# Patient Record
Sex: Female | Born: 1950
Health system: Southern US, Community
[De-identification: ages and names within clinical notes are randomized; demographics above are authoritative.]

## PROBLEM LIST (undated history)

## (undated) DIAGNOSIS — F419 Anxiety disorder, unspecified: Secondary | ICD-10-CM

## (undated) DIAGNOSIS — E785 Hyperlipidemia, unspecified: Secondary | ICD-10-CM

## (undated) DIAGNOSIS — I10 Essential (primary) hypertension: Secondary | ICD-10-CM

## (undated) DIAGNOSIS — K219 Gastro-esophageal reflux disease without esophagitis: Secondary | ICD-10-CM

## (undated) DIAGNOSIS — C801 Malignant (primary) neoplasm, unspecified: Secondary | ICD-10-CM

## (undated) DIAGNOSIS — E079 Disorder of thyroid, unspecified: Secondary | ICD-10-CM

## (undated) HISTORY — DX: Hyperlipidemia, unspecified: E78.5

## (undated) HISTORY — DX: Malignant (primary) neoplasm, unspecified: C80.1

## (undated) HISTORY — DX: Anxiety disorder, unspecified: F41.9

## (undated) HISTORY — DX: Gastro-esophageal reflux disease without esophagitis: K21.9

## (undated) HISTORY — PX: TONSILLECTOMY: SUR1361

---

## 2017-02-19 ENCOUNTER — Encounter: Payer: Self-pay | Admitting: Gastroenterology

## 2017-04-11 ENCOUNTER — Encounter: Payer: Self-pay | Admitting: Gastroenterology

## 2017-05-02 ENCOUNTER — Encounter: Payer: Self-pay | Admitting: Family Medicine

## 2018-03-11 ENCOUNTER — Encounter: Payer: Self-pay | Admitting: Gastroenterology

## 2018-05-08 ENCOUNTER — Encounter: Payer: Self-pay | Admitting: Gastroenterology

## 2019-09-02 ENCOUNTER — Encounter: Payer: Self-pay | Admitting: Gastroenterology

## 2019-10-08 ENCOUNTER — Encounter: Payer: Self-pay | Admitting: Gastroenterology

## 2020-01-03 ENCOUNTER — Ambulatory Visit: Payer: Self-pay

## 2020-01-09 ENCOUNTER — Ambulatory Visit: Payer: Medicare PPO | Attending: Internal Medicine

## 2020-01-09 DIAGNOSIS — Z23 Encounter for immunization: Secondary | ICD-10-CM | POA: Insufficient documentation

## 2020-01-09 NOTE — Progress Notes (Signed)
   Covid-19 Vaccination Clinic  Name:  Bianca Nelson    MRN: [id number] DOB: September 07, 1951  01/09/2020  [name] was observed post Covid-19 immunization for 15 minutes without incidence. She was provided with Vaccine Information Sheet and instruction to access the V-Safe system.   [name] was instructed to call 911 with any severe reactions post vaccine: Marland Kitchen Difficulty breathing  . Swelling of your face and throat  . A fast heartbeat  . A bad rash all over your body  . Dizziness and weakness    Immunizations Administered    Name Date Dose VIS Date Route   Pfizer COVID-19 Vaccine 01/09/2020  2:28 PM 0.3 mL 11/14/2019 Intramuscular   Manufacturer: [place]   Lot: [id number]   Christopher: [id number]

## 2020-01-14 ENCOUNTER — Ambulatory Visit: Payer: Self-pay

## 2020-02-03 ENCOUNTER — Ambulatory Visit: Payer: Medicare PPO | Attending: Internal Medicine

## 2020-02-03 DIAGNOSIS — Z23 Encounter for immunization: Secondary | ICD-10-CM | POA: Insufficient documentation

## 2020-02-03 NOTE — Progress Notes (Signed)
   Covid-19 Vaccination Clinic  Name:  Bianca Nelson    MRN: [id number] DOB: 04-07-1951  02/03/2020  Bianca Nelson was observed post Covid-19 immunization for 30 minutes based on pre-vaccination screening without incident. She was provided with Vaccine Information Sheet and instruction to access the V-Safe system.   Bianca Nelson was instructed to call 911 with any severe reactions post vaccine: Marland Kitchen Difficulty breathing  . Swelling of face and throat  . A fast heartbeat  . A bad rash all over body  . Dizziness and weakness   Immunizations Administered    Name Date Dose VIS Date Route   Pfizer COVID-19 Vaccine 02/03/2020  2:45 PM 0.3 mL 11/14/2019 Intramuscular   Manufacturer: [place]   Lot: [id number]   [place]: [id number]

## 2020-03-16 ENCOUNTER — Emergency Department (HOSPITAL_COMMUNITY): Payer: Medicare PPO

## 2020-03-16 ENCOUNTER — Emergency Department (HOSPITAL_COMMUNITY)
Admission: EM | Admit: 2020-03-16 | Discharge: 2020-03-16 | Disposition: A | Discharge status: Home / Self Care | Payer: Medicare PPO | Attending: Emergency Medicine | Admitting: Emergency Medicine

## 2020-03-16 ENCOUNTER — Other Ambulatory Visit: Payer: Self-pay

## 2020-03-16 ENCOUNTER — Encounter (HOSPITAL_COMMUNITY): Payer: Self-pay | Admitting: Emergency Medicine

## 2020-03-16 DIAGNOSIS — R002 Palpitations: Secondary | ICD-10-CM | POA: Insufficient documentation

## 2020-03-16 DIAGNOSIS — I10 Essential (primary) hypertension: Secondary | ICD-10-CM | POA: Insufficient documentation

## 2020-03-16 HISTORY — DX: Essential (primary) hypertension: I10

## 2020-03-16 HISTORY — DX: Disorder of thyroid, unspecified: E07.9

## 2020-03-16 LAB — BASIC METABOLIC PANEL
Anion gap: 10 (ref 5–15)
BUN: 15 mg/dL (ref 8–23)
CO2: 25 mmol/L (ref 22–32)
Calcium: 9.4 mg/dL (ref 8.9–10.3)
Chloride: 99 mmol/L (ref 98–111)
Creatinine, Ser: 0.71 mg/dL (ref 0.44–1.00)
GFR calc Af Amer: >60 mL/min (ref >60)
GFR calc non Af Amer: >60 mL/min (ref >60)
Glucose, Bld: 118 mg/dL — ABNORMAL HIGH (ref 70–99)
Potassium: 4.2 mmol/L (ref 3.5–5.1)
Sodium: 134 mmol/L — ABNORMAL LOW (ref 135–145)

## 2020-03-16 LAB — CBC
HCT: 41.2 % (ref 36.0–46.0)
Hemoglobin: 13.6 g/dL (ref 12.0–15.0)
MCH: 29.5 pg (ref 26.0–34.0)
MCHC: 33 g/dL (ref 30.0–36.0)
MCV: 89.4 fL (ref 80.0–100.0)
Platelets: 483 10*3/uL — ABNORMAL HIGH (ref 150–400)
RBC: 4.61 MIL/uL (ref 3.87–5.11)
RDW: 12.6 % (ref 11.5–15.5)
WBC: 10.5 10*3/uL (ref 4.0–10.5)
nRBC: 0 % (ref 0.0–0.2)

## 2020-03-16 LAB — TROPONIN I (HIGH SENSITIVITY)
Troponin I (High Sensitivity): <2 ng/L (ref <18)
Troponin I (High Sensitivity): <2 ng/L (ref <18)

## 2020-03-16 LAB — TSH: TSH: 2.616 u[IU]/mL (ref 0.350–4.500)

## 2020-03-16 MED ORDER — SODIUM CHLORIDE 0.9% FLUSH: 3.0000 mL | Freq: Once | INTRAVENOUS | Status: DC

## 2020-03-16 NOTE — ED Notes (Signed)
ED Provider at bedside. 

## 2020-03-16 NOTE — Discharge Instructions (Addendum)
We saw you in the ER for palpitations. All the results in the ER are normal, labs and imaging. We are not sure what is causing your symptoms.  The workup in the ER is not complete, and is limited to screening for life threatening and emergent conditions only, so please see a primary care doctor for further evaluation.

## 2020-03-16 NOTE — ED Notes (Signed)
An After Visit Summary was printed and given to the patient. Discharge instructions given and no further questions at this time. Pt denies palpitations at this time.

## 2020-03-16 NOTE — ED Provider Notes (Addendum)
[facility] DEPT Provider Note   CSN: [id number] Arrival date & time: 03/16/20  0736     History Chief Complaint  Patient presents with  . Palpitations    Bianca Nelson is a 69 y.o. female.  HPI     69 year old female comes in a chief complaint of palpitations. Patient has history of hypertension and thyroid disease.  There is no history of CAD/CHF/arrhythmia.  Patient reports that last night she started having episodes of palpitations around 11 PM.  The symptoms lasted for several minutes.  She describes the symptoms as " pounding in her chest" " fast heartbeat".  She had some associated neck discomfort.  Patient denies any associated dizziness, lightheadedness.  She woke up early in the morning and had similar symptoms and decided to come to the ER.  She suspects the last episode of palpitations was around 7 AM.   Patient has pain in her neck.  She denies any recent exertional chest pain, jaw pain.  There is no associated nausea, shortness of breath.  Past Medical History:  Diagnosis Date  . Hypertension   . Thyroid disease     There are no problems to display for this patient.    OB History   No obstetric history on file.     No family history on file.  Social History   Tobacco Use  . Smoking status: Never Smoker  . Smokeless tobacco: Never Used  Substance Use Topics  . Alcohol use: Never  . Drug use: Never    Home Medications Prior to Admission medications   Not on File    Allergies    Penicillins  Review of Systems   Review of Systems  Constitutional: Positive for activity change.  Respiratory: Negative for shortness of breath.   Cardiovascular: Positive for palpitations. Negative for chest pain.  Gastrointestinal: Negative for nausea and vomiting.  Allergic/Immunologic: Negative for immunocompromised state.  Neurological: Negative for dizziness.    Physical Exam Updated Vital Signs BP 138/74   Pulse 98    Temp 97.7 F (36.5 C) (Oral)   Resp 18   SpO2 100%   Physical Exam Vitals and nursing note reviewed.  Constitutional:      Appearance: She is well-developed.  HENT:     Head: Normocephalic and atraumatic.  Cardiovascular:     Rate and Rhythm: Normal rate.  Pulmonary:     Effort: Pulmonary effort is normal.  Abdominal:     General: Bowel sounds are normal.  Musculoskeletal:     Cervical back: Normal range of motion and neck supple.  Skin:    General: Skin is warm and dry.  Neurological:     Mental Status: She is alert and oriented to person, place, and time.     ED Results / Procedures / Treatments   Labs (all labs ordered are listed, but only abnormal results are displayed) Labs Reviewed  BASIC METABOLIC PANEL - Abnormal; Notable for the following components:      Result Value   Sodium 134 (*)    Glucose, Bld 118 (*)    All other components within normal limits  CBC - Abnormal; Notable for the following components:   Platelets 483 (*)    All other components within normal limits  TSH  TROPONIN I (HIGH SENSITIVITY)  TROPONIN I (HIGH SENSITIVITY)    EKG EKG Interpretation  Date/Time:  Tuesday March 16 2020 07:45:03 EDT Ventricular Rate:  111 PR Interval:    QRS Duration: 95  QT Interval:  358 QTC Calculation: 487 R Axis:   86 Text Interpretation: sinus tachycardia Borderline right axis deviation Repol abnrm suggests ischemia, diffuse leads Nonspecific ST and T wave abnormality No old tracing to compare Confirmed by Varney Biles [phone]) on 03/16/2020 10:19:20 AM   Radiology DG Chest 2 View  Result Date: 03/16/2020 CLINICAL DATA:  Palpitations.  No other chest complaints. EXAM: CHEST - 2 VIEW COMPARISON:  None. FINDINGS: The heart size and mediastinal contours are within normal limits. Both lungs are clear. The visualized skeletal structures are unremarkable. IMPRESSION: No active cardiopulmonary disease. Electronically Signed   By: Kathreen Devoid   On:  03/16/2020 08:19    Procedures Procedures (including critical care time)  Medications Ordered in ED Medications  sodium chloride flush (NS) 0.9 % injection 3 mL (0 mLs Intravenous Hold 03/16/20 1143)    ED Course  I have reviewed the triage vital signs and the nursing notes.  Pertinent labs & imaging results that were available during my care of the patient were reviewed by me and considered in my medical decision making (see chart for details).  Clinical Course as of Mar 16 1429  Tue Mar 16, 2020  1430 All of the labs are reassuring.  Patient had no abnormal findings on the telemetry that are concerning.  She will be advised to follow-up with PCP for her palpitations.   [AN]    Clinical Course User Index [AN] Varney Biles, MD   MDM Rules/Calculators/A&P                      69 year old comes in a chief complaint of palpitations and neck pain.  Symptoms started yesterday and appears to be intermittent.  She has history of hypertension and thyroid disease.  No history of heavy smoking, alcohol consumption, cardiac disease.  Family history is reassuring as well.  Currently she is not having palpitations and her exam is normal.  Differential diagnosis would include ACS presenting is nonspecific neck pain and also underlying A. fib versus PSVT.  She has thyroid disease therefore we will get TSH as well.  Final Clinical Impression(s) / ED Diagnoses Final diagnoses:  Palpitations    Rx / DC Orders ED Discharge Orders    None       Varney Biles, MD 03/16/20 1024    Varney Biles, MD 03/16/20 1430

## 2020-03-16 NOTE — ED Triage Notes (Signed)
Patient here from home reporting "palpitations all night". Denies pain. No blood thinner. Hx of thyroid disease and htn.

## 2020-04-30 ENCOUNTER — Encounter: Payer: Self-pay | Admitting: Gastroenterology

## 2020-05-21 DIAGNOSIS — E782 Mixed hyperlipidemia: Secondary | ICD-10-CM | POA: Diagnosis not present

## 2020-05-21 DIAGNOSIS — E039 Hypothyroidism, unspecified: Secondary | ICD-10-CM | POA: Diagnosis not present

## 2020-05-21 DIAGNOSIS — I10 Essential (primary) hypertension: Secondary | ICD-10-CM | POA: Diagnosis not present

## 2020-05-25 DIAGNOSIS — E782 Mixed hyperlipidemia: Secondary | ICD-10-CM | POA: Diagnosis not present

## 2020-05-25 DIAGNOSIS — E039 Hypothyroidism, unspecified: Secondary | ICD-10-CM | POA: Diagnosis not present

## 2020-05-25 DIAGNOSIS — I10 Essential (primary) hypertension: Secondary | ICD-10-CM | POA: Diagnosis not present

## 2020-06-10 ENCOUNTER — Encounter: Payer: Medicare PPO | Admitting: Gastroenterology

## 2020-07-02 ENCOUNTER — Other Ambulatory Visit: Payer: Self-pay

## 2020-07-02 ENCOUNTER — Encounter: Payer: Self-pay | Admitting: Gastroenterology

## 2020-07-02 ENCOUNTER — Ambulatory Visit (AMBULATORY_SURGERY_CENTER): Payer: Self-pay | Admitting: *Deleted

## 2020-07-02 VITALS — Ht 69.0 in | Wt 144.0 lb

## 2020-07-02 DIAGNOSIS — Z1211 Encounter for screening for malignant neoplasm of colon: Secondary | ICD-10-CM

## 2020-07-02 MED ORDER — SUTAB 1479-225-188 MG PO TABS: 1.0000 | ORAL_TABLET | ORAL | 0 refills | Status: DC

## 2020-07-02 NOTE — Progress Notes (Signed)
Patient is here in-person for PV. Patient denies any allergies to eggs or soy. Patient denies any problems with anesthesia/sedation. Patient denies any oxygen use at home. Patient denies taking any diet/weight loss medications or blood thinners. Patient is not being treated for MRSA or C-diff. Patient is aware of our care-partner policy and ZOXWR-60 safety protocol. EMMI education assisgned to the patient for the procedure, this was explained and instructions given to patient.   COVID-19 vaccines completed on  02/03/2020. sutab rx given to pt due to constipation issues.  Prep Prescription coupon given to the patient.

## 2020-07-20 ENCOUNTER — Telehealth: Payer: Self-pay | Admitting: Gastroenterology

## 2020-07-20 NOTE — Telephone Encounter (Signed)
Bianca Nelson,  Thank you for letting me know.  Screening colonoscopy, so she can call to reschedule when convenient for her.  - HD

## 2020-07-20 NOTE — Telephone Encounter (Signed)
Patient called cancelled appt for colonoscopy 07/23/20 at 1:30pm. Patient cancelled due to her pet having surgery.

## 2020-07-23 ENCOUNTER — Encounter: Payer: Medicare PPO | Admitting: Gastroenterology

## 2020-08-24 DIAGNOSIS — Z0000 Encounter for general adult medical examination without abnormal findings: Secondary | ICD-10-CM | POA: Diagnosis not present

## 2020-08-24 DIAGNOSIS — Z1159 Encounter for screening for other viral diseases: Secondary | ICD-10-CM | POA: Diagnosis not present

## 2020-08-26 DIAGNOSIS — Z1211 Encounter for screening for malignant neoplasm of colon: Secondary | ICD-10-CM | POA: Diagnosis not present

## 2020-08-26 DIAGNOSIS — Z0000 Encounter for general adult medical examination without abnormal findings: Secondary | ICD-10-CM | POA: Diagnosis not present

## 2020-08-26 DIAGNOSIS — Z1159 Encounter for screening for other viral diseases: Secondary | ICD-10-CM | POA: Diagnosis not present

## 2020-08-26 DIAGNOSIS — J309 Allergic rhinitis, unspecified: Secondary | ICD-10-CM | POA: Diagnosis not present

## 2020-09-08 DIAGNOSIS — Z23 Encounter for immunization: Secondary | ICD-10-CM | POA: Diagnosis not present

## 2020-10-20 DIAGNOSIS — Z1231 Encounter for screening mammogram for malignant neoplasm of breast: Secondary | ICD-10-CM | POA: Diagnosis not present

## 2020-11-11 DIAGNOSIS — Z1331 Encounter for screening for depression: Secondary | ICD-10-CM | POA: Diagnosis not present

## 2020-11-11 DIAGNOSIS — Z1339 Encounter for screening examination for other mental health and behavioral disorders: Secondary | ICD-10-CM | POA: Diagnosis not present

## 2020-11-11 DIAGNOSIS — Z0000 Encounter for general adult medical examination without abnormal findings: Secondary | ICD-10-CM | POA: Diagnosis not present

## 2020-12-02 ENCOUNTER — Other Ambulatory Visit: Payer: Self-pay | Admitting: Family Medicine

## 2020-12-02 DIAGNOSIS — E2839 Other primary ovarian failure: Secondary | ICD-10-CM

## 2020-12-23 DIAGNOSIS — E782 Mixed hyperlipidemia: Secondary | ICD-10-CM | POA: Diagnosis not present

## 2020-12-23 DIAGNOSIS — F331 Major depressive disorder, recurrent, moderate: Secondary | ICD-10-CM | POA: Diagnosis not present

## 2020-12-23 DIAGNOSIS — G4700 Insomnia, unspecified: Secondary | ICD-10-CM | POA: Diagnosis not present

## 2020-12-23 DIAGNOSIS — F411 Generalized anxiety disorder: Secondary | ICD-10-CM | POA: Diagnosis not present

## 2021-02-10 DIAGNOSIS — I10 Essential (primary) hypertension: Secondary | ICD-10-CM | POA: Diagnosis not present

## 2021-02-10 DIAGNOSIS — E782 Mixed hyperlipidemia: Secondary | ICD-10-CM | POA: Diagnosis not present

## 2021-02-10 DIAGNOSIS — E559 Vitamin D deficiency, unspecified: Secondary | ICD-10-CM | POA: Diagnosis not present

## 2021-02-10 DIAGNOSIS — E039 Hypothyroidism, unspecified: Secondary | ICD-10-CM | POA: Diagnosis not present

## 2021-02-14 DIAGNOSIS — I10 Essential (primary) hypertension: Secondary | ICD-10-CM | POA: Diagnosis not present

## 2021-02-14 DIAGNOSIS — E782 Mixed hyperlipidemia: Secondary | ICD-10-CM | POA: Diagnosis not present

## 2021-02-14 DIAGNOSIS — F331 Major depressive disorder, recurrent, moderate: Secondary | ICD-10-CM | POA: Diagnosis not present

## 2021-02-14 DIAGNOSIS — G4700 Insomnia, unspecified: Secondary | ICD-10-CM | POA: Diagnosis not present

## 2021-02-14 DIAGNOSIS — F411 Generalized anxiety disorder: Secondary | ICD-10-CM | POA: Diagnosis not present

## 2021-03-24 ENCOUNTER — Other Ambulatory Visit: Payer: Medicare PPO

## 2021-04-07 DIAGNOSIS — E039 Hypothyroidism, unspecified: Secondary | ICD-10-CM | POA: Diagnosis not present

## 2021-04-07 DIAGNOSIS — R5383 Other fatigue: Secondary | ICD-10-CM | POA: Diagnosis not present

## 2021-04-07 DIAGNOSIS — E782 Mixed hyperlipidemia: Secondary | ICD-10-CM | POA: Diagnosis not present

## 2021-04-07 DIAGNOSIS — I10 Essential (primary) hypertension: Secondary | ICD-10-CM | POA: Diagnosis not present

## 2021-04-21 DIAGNOSIS — E039 Hypothyroidism, unspecified: Secondary | ICD-10-CM | POA: Diagnosis not present

## 2021-04-21 DIAGNOSIS — I10 Essential (primary) hypertension: Secondary | ICD-10-CM | POA: Diagnosis not present

## 2021-04-21 DIAGNOSIS — E782 Mixed hyperlipidemia: Secondary | ICD-10-CM | POA: Diagnosis not present

## 2021-04-21 DIAGNOSIS — J309 Allergic rhinitis, unspecified: Secondary | ICD-10-CM | POA: Diagnosis not present

## 2021-04-21 DIAGNOSIS — R739 Hyperglycemia, unspecified: Secondary | ICD-10-CM | POA: Diagnosis not present

## 2021-06-10 DIAGNOSIS — E782 Mixed hyperlipidemia: Secondary | ICD-10-CM | POA: Diagnosis not present

## 2021-06-10 DIAGNOSIS — E039 Hypothyroidism, unspecified: Secondary | ICD-10-CM | POA: Diagnosis not present

## 2021-06-14 DIAGNOSIS — E782 Mixed hyperlipidemia: Secondary | ICD-10-CM | POA: Diagnosis not present

## 2021-06-14 DIAGNOSIS — E039 Hypothyroidism, unspecified: Secondary | ICD-10-CM | POA: Diagnosis not present

## 2021-06-14 DIAGNOSIS — I10 Essential (primary) hypertension: Secondary | ICD-10-CM | POA: Diagnosis not present

## 2021-06-14 DIAGNOSIS — M2550 Pain in unspecified joint: Secondary | ICD-10-CM | POA: Diagnosis not present

## 2021-06-14 DIAGNOSIS — J309 Allergic rhinitis, unspecified: Secondary | ICD-10-CM | POA: Diagnosis not present

## 2021-06-14 DIAGNOSIS — Z7185 Encounter for immunization safety counseling: Secondary | ICD-10-CM | POA: Diagnosis not present

## 2021-08-09 IMAGING — CR DG CHEST 2V
2 series · 2 of 2 positions shown · non-contrast
Comparison: None.

CLINICAL DATA: Palpitations.  No other chest complaints.

EXAM:
CHEST - 2 VIEW

[w chest pa]
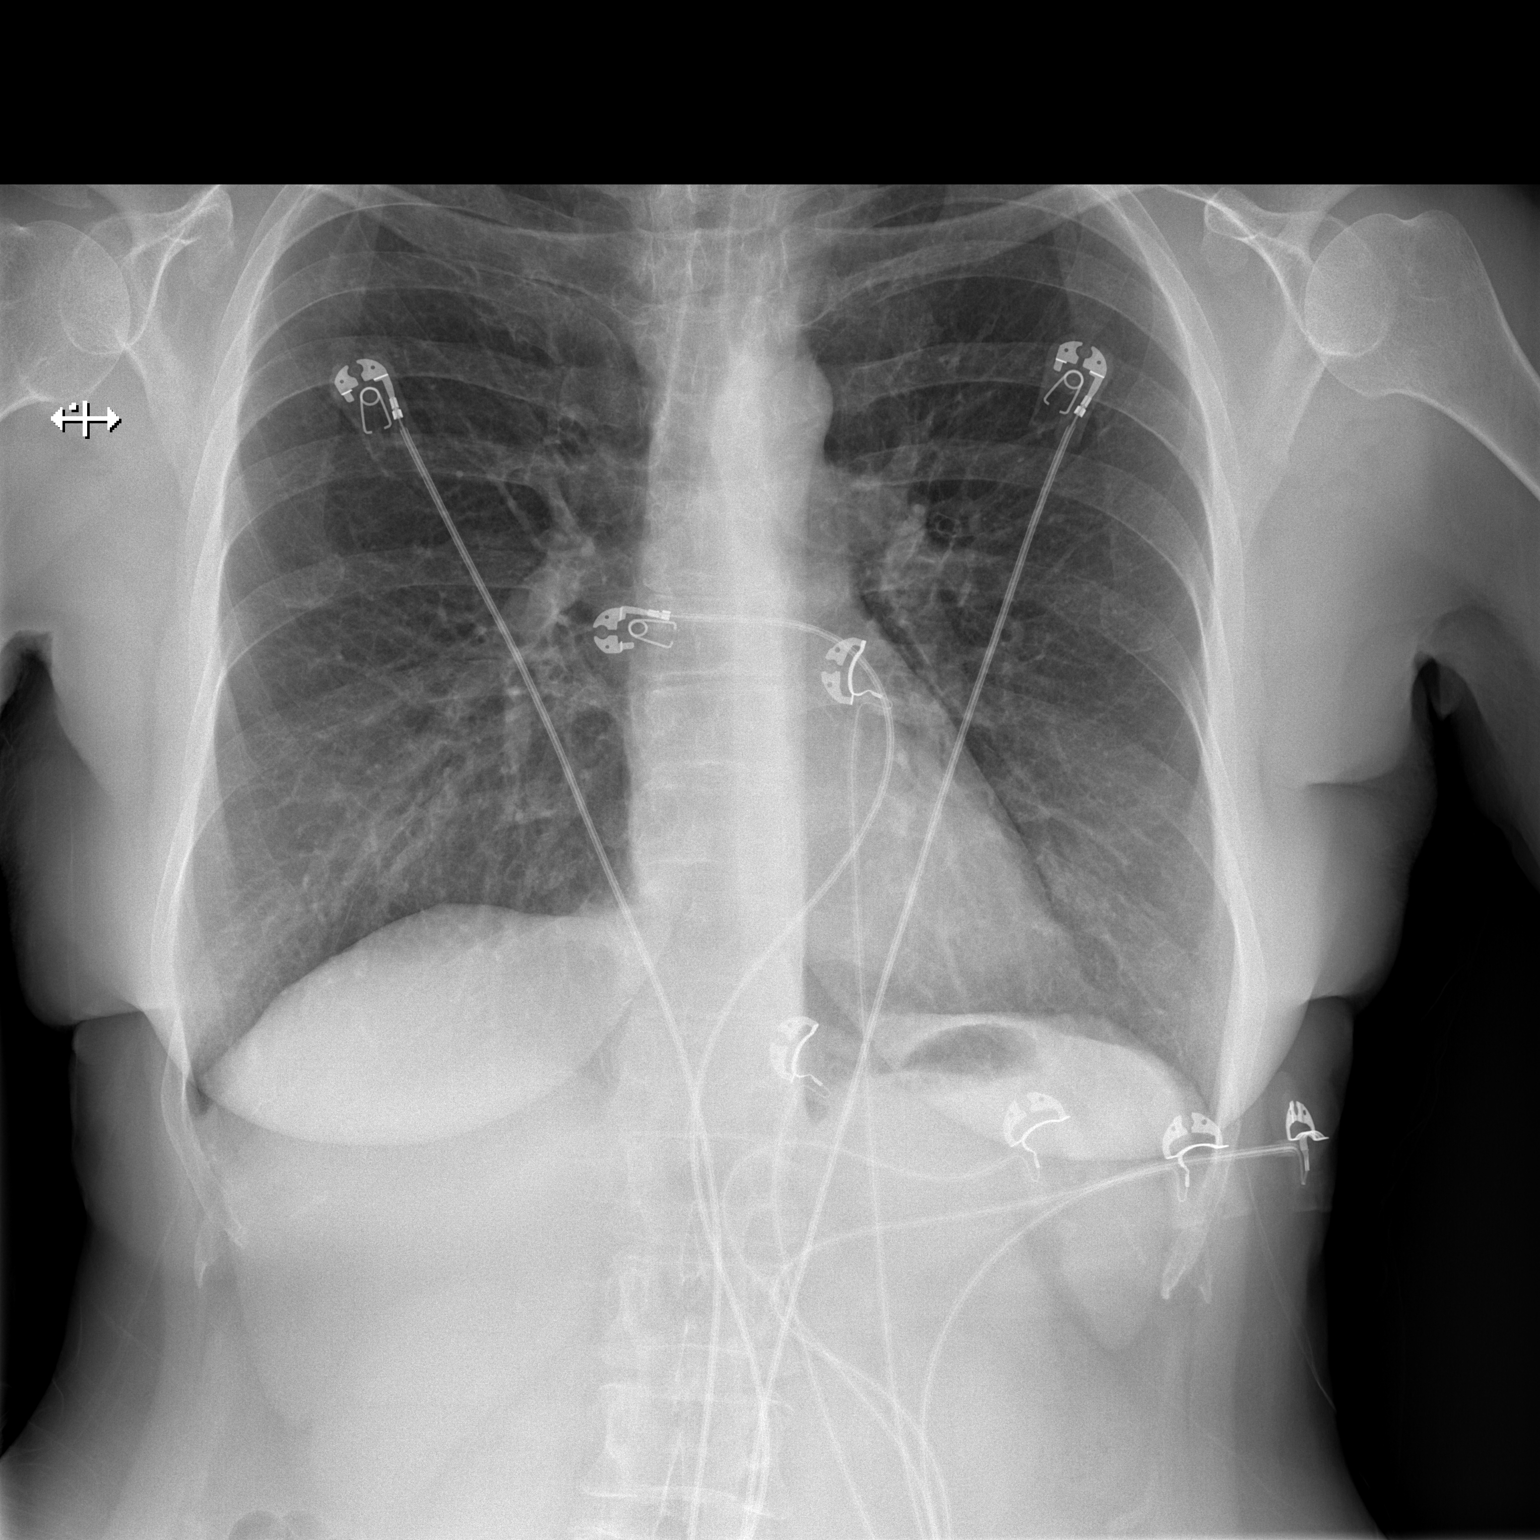

[w chest lat]
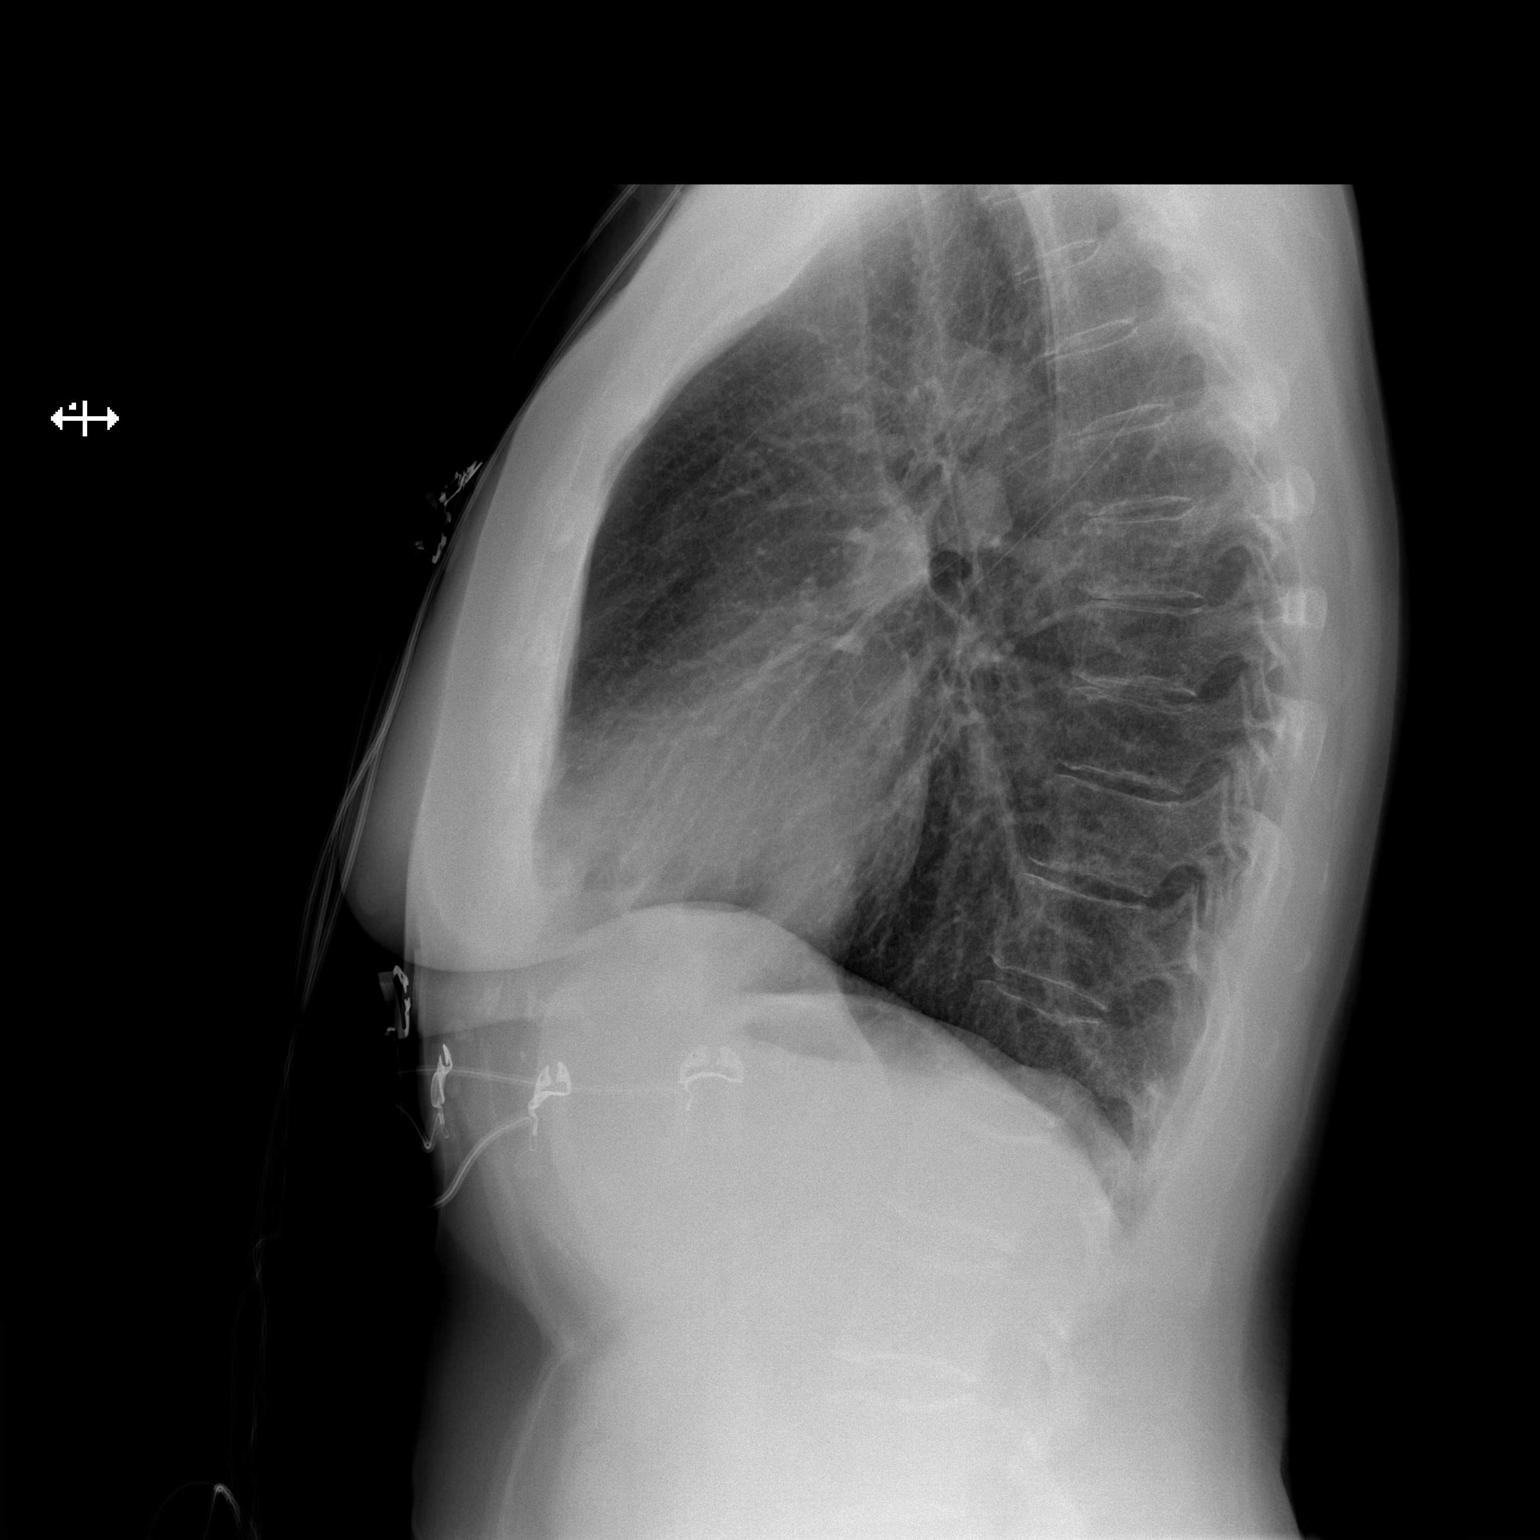

[2 of 2 positions shown; findings below may reference images not displayed]

FINDINGS: The heart size and mediastinal contours are within normal limits.
Both lungs are clear. The visualized skeletal structures are
unremarkable.
IMPRESSION: No active cardiopulmonary disease.

## 2021-09-12 DIAGNOSIS — E782 Mixed hyperlipidemia: Secondary | ICD-10-CM | POA: Diagnosis not present

## 2021-09-12 DIAGNOSIS — I10 Essential (primary) hypertension: Secondary | ICD-10-CM | POA: Diagnosis not present

## 2021-09-12 DIAGNOSIS — E039 Hypothyroidism, unspecified: Secondary | ICD-10-CM | POA: Diagnosis not present

## 2021-09-12 DIAGNOSIS — Z0000 Encounter for general adult medical examination without abnormal findings: Secondary | ICD-10-CM | POA: Diagnosis not present

## 2021-09-20 DIAGNOSIS — E782 Mixed hyperlipidemia: Secondary | ICD-10-CM | POA: Diagnosis not present

## 2021-09-20 DIAGNOSIS — Z0000 Encounter for general adult medical examination without abnormal findings: Secondary | ICD-10-CM | POA: Diagnosis not present

## 2021-09-22 ENCOUNTER — Other Ambulatory Visit: Payer: Self-pay | Admitting: Family Medicine

## 2021-09-22 DIAGNOSIS — E2839 Other primary ovarian failure: Secondary | ICD-10-CM

## 2021-09-23 ENCOUNTER — Other Ambulatory Visit: Payer: Medicare PPO

## 2021-09-29 DIAGNOSIS — Z23 Encounter for immunization: Secondary | ICD-10-CM | POA: Diagnosis not present

## 2021-10-25 DIAGNOSIS — Z1331 Encounter for screening for depression: Secondary | ICD-10-CM | POA: Diagnosis not present

## 2021-10-25 DIAGNOSIS — Z1339 Encounter for screening examination for other mental health and behavioral disorders: Secondary | ICD-10-CM | POA: Diagnosis not present

## 2021-10-25 DIAGNOSIS — Z0000 Encounter for general adult medical examination without abnormal findings: Secondary | ICD-10-CM | POA: Diagnosis not present

## 2021-12-08 DIAGNOSIS — Z1231 Encounter for screening mammogram for malignant neoplasm of breast: Secondary | ICD-10-CM | POA: Diagnosis not present

## 2021-12-16 DIAGNOSIS — H40003 Preglaucoma, unspecified, bilateral: Secondary | ICD-10-CM | POA: Diagnosis not present

## 2021-12-16 DIAGNOSIS — H2513 Age-related nuclear cataract, bilateral: Secondary | ICD-10-CM | POA: Diagnosis not present

## 2021-12-16 DIAGNOSIS — H43393 Other vitreous opacities, bilateral: Secondary | ICD-10-CM | POA: Diagnosis not present

## 2021-12-30 DIAGNOSIS — R739 Hyperglycemia, unspecified: Secondary | ICD-10-CM | POA: Diagnosis not present

## 2021-12-30 DIAGNOSIS — E782 Mixed hyperlipidemia: Secondary | ICD-10-CM | POA: Diagnosis not present

## 2022-01-02 DIAGNOSIS — R7303 Prediabetes: Secondary | ICD-10-CM | POA: Diagnosis not present

## 2022-01-02 DIAGNOSIS — E782 Mixed hyperlipidemia: Secondary | ICD-10-CM | POA: Diagnosis not present

## 2022-01-02 DIAGNOSIS — I10 Essential (primary) hypertension: Secondary | ICD-10-CM | POA: Diagnosis not present

## 2022-02-21 ENCOUNTER — Other Ambulatory Visit: Payer: Medicare PPO

## 2022-05-29 DIAGNOSIS — D519 Vitamin B12 deficiency anemia, unspecified: Secondary | ICD-10-CM | POA: Diagnosis not present

## 2022-05-29 DIAGNOSIS — E782 Mixed hyperlipidemia: Secondary | ICD-10-CM | POA: Diagnosis not present

## 2022-05-29 DIAGNOSIS — I10 Essential (primary) hypertension: Secondary | ICD-10-CM | POA: Diagnosis not present

## 2022-05-29 DIAGNOSIS — D649 Anemia, unspecified: Secondary | ICD-10-CM | POA: Diagnosis not present

## 2022-05-29 DIAGNOSIS — R7303 Prediabetes: Secondary | ICD-10-CM | POA: Diagnosis not present

## 2022-05-29 DIAGNOSIS — D518 Other vitamin B12 deficiency anemias: Secondary | ICD-10-CM | POA: Diagnosis not present

## 2022-06-02 DIAGNOSIS — D649 Anemia, unspecified: Secondary | ICD-10-CM | POA: Diagnosis not present

## 2022-06-02 DIAGNOSIS — I10 Essential (primary) hypertension: Secondary | ICD-10-CM | POA: Diagnosis not present

## 2022-06-02 DIAGNOSIS — E782 Mixed hyperlipidemia: Secondary | ICD-10-CM | POA: Diagnosis not present

## 2022-06-02 DIAGNOSIS — R7303 Prediabetes: Secondary | ICD-10-CM | POA: Diagnosis not present

## 2022-08-03 ENCOUNTER — Ambulatory Visit
Admission: RE | Admit: 2022-08-03 | Discharge: 2022-08-03 | Disposition: A | Discharge status: Home / Self Care | Payer: Medicare PPO | Source: Ambulatory Visit | Attending: Family Medicine | Admitting: Family Medicine

## 2022-08-03 DIAGNOSIS — E2839 Other primary ovarian failure: Secondary | ICD-10-CM

## 2022-08-03 DIAGNOSIS — M85852 Other specified disorders of bone density and structure, left thigh: Secondary | ICD-10-CM | POA: Diagnosis not present

## 2022-08-03 DIAGNOSIS — M810 Age-related osteoporosis without current pathological fracture: Secondary | ICD-10-CM | POA: Diagnosis not present

## 2022-08-03 DIAGNOSIS — Z780 Asymptomatic menopausal state: Secondary | ICD-10-CM | POA: Diagnosis not present

## 2022-08-21 DIAGNOSIS — D649 Anemia, unspecified: Secondary | ICD-10-CM | POA: Diagnosis not present

## 2022-08-25 DIAGNOSIS — Z862 Personal history of diseases of the blood and blood-forming organs and certain disorders involving the immune mechanism: Secondary | ICD-10-CM | POA: Diagnosis not present

## 2022-08-25 DIAGNOSIS — E782 Mixed hyperlipidemia: Secondary | ICD-10-CM | POA: Diagnosis not present

## 2022-09-14 DIAGNOSIS — I10 Essential (primary) hypertension: Secondary | ICD-10-CM | POA: Diagnosis not present

## 2022-09-14 DIAGNOSIS — E039 Hypothyroidism, unspecified: Secondary | ICD-10-CM | POA: Diagnosis not present

## 2022-09-14 DIAGNOSIS — Z862 Personal history of diseases of the blood and blood-forming organs and certain disorders involving the immune mechanism: Secondary | ICD-10-CM | POA: Diagnosis not present

## 2022-09-29 DIAGNOSIS — Z0000 Encounter for general adult medical examination without abnormal findings: Secondary | ICD-10-CM | POA: Diagnosis not present

## 2022-09-29 DIAGNOSIS — M791 Myalgia, unspecified site: Secondary | ICD-10-CM | POA: Diagnosis not present

## 2022-09-29 DIAGNOSIS — Z23 Encounter for immunization: Secondary | ICD-10-CM | POA: Diagnosis not present

## 2022-09-29 DIAGNOSIS — I10 Essential (primary) hypertension: Secondary | ICD-10-CM | POA: Diagnosis not present

## 2022-09-29 DIAGNOSIS — Z1211 Encounter for screening for malignant neoplasm of colon: Secondary | ICD-10-CM | POA: Diagnosis not present

## 2022-10-30 DIAGNOSIS — Z1339 Encounter for screening examination for other mental health and behavioral disorders: Secondary | ICD-10-CM | POA: Diagnosis not present

## 2022-10-30 DIAGNOSIS — Z1331 Encounter for screening for depression: Secondary | ICD-10-CM | POA: Diagnosis not present

## 2022-10-30 DIAGNOSIS — Z0000 Encounter for general adult medical examination without abnormal findings: Secondary | ICD-10-CM | POA: Diagnosis not present

## 2022-11-02 ENCOUNTER — Encounter: Payer: Self-pay | Admitting: Gastroenterology

## 2022-11-20 DIAGNOSIS — E782 Mixed hyperlipidemia: Secondary | ICD-10-CM | POA: Diagnosis not present

## 2022-11-20 DIAGNOSIS — R7303 Prediabetes: Secondary | ICD-10-CM | POA: Diagnosis not present

## 2022-11-23 ENCOUNTER — Other Ambulatory Visit: Payer: Self-pay | Admitting: Nurse Practitioner

## 2022-11-23 DIAGNOSIS — I10 Essential (primary) hypertension: Secondary | ICD-10-CM | POA: Diagnosis not present

## 2022-11-23 DIAGNOSIS — Z8249 Family history of ischemic heart disease and other diseases of the circulatory system: Secondary | ICD-10-CM

## 2022-11-23 DIAGNOSIS — E782 Mixed hyperlipidemia: Secondary | ICD-10-CM

## 2022-11-23 DIAGNOSIS — R7303 Prediabetes: Secondary | ICD-10-CM | POA: Diagnosis not present

## 2022-12-01 ENCOUNTER — Other Ambulatory Visit: Payer: Self-pay | Admitting: Family Medicine

## 2022-12-01 DIAGNOSIS — Z1231 Encounter for screening mammogram for malignant neoplasm of breast: Secondary | ICD-10-CM

## 2022-12-07 ENCOUNTER — Ambulatory Visit (AMBULATORY_SURGERY_CENTER): Payer: Medicare PPO | Admitting: *Deleted

## 2022-12-07 VITALS — Ht 69.0 in | Wt 158.0 lb

## 2022-12-07 DIAGNOSIS — Z1211 Encounter for screening for malignant neoplasm of colon: Secondary | ICD-10-CM

## 2022-12-07 MED ORDER — NA SULFATE-K SULFATE-MG SULF 17.5-3.13-1.6 GM/177ML PO SOLN: 1.0000 | Freq: Once | ORAL | 0 refills | Status: AC

## 2022-12-07 MED ORDER — ONDANSETRON HCL 4 MG PO TABS: 4.0000 mg | ORAL_TABLET | Freq: Three times a day (TID) | ORAL | 0 refills | Status: AC | PRN

## 2022-12-07 NOTE — Progress Notes (Signed)
No egg or soy allergy known to patient  No issues known to pt with past sedation with any surgeries or procedures Patient denies ever being told they had issues or difficulty with intubation  No issues moving head or neck No issues with swallowing No FH of Malignant Hyperthermia Pt is not on diet pills Pt is not on  home 02  Pt is not on blood thinners  Pt denies issues with constipation  Pt is not on dialysis Pt denies any upcoming cardiac testing CT scan for measure of calcium after Pt encouraged to use to use Singlecare or Goodrx to reduce cost Patient's chart reviewed by Osvaldo Angst CNRA prior to previsit and patient appropriate for the [facility].  Previsit completed and red dot placed by patient's name on their procedure day (on provider's schedule).  . Visit done by phone Instructions sent by mail  with coupon. Pt does not show my chart  Pt states she may get nausea Zofran ordered

## 2022-12-18 ENCOUNTER — Telehealth: Payer: Self-pay | Admitting: Gastroenterology

## 2022-12-18 NOTE — Telephone Encounter (Signed)
Spoke with pt and sent new instructions via MyChart

## 2022-12-18 NOTE — Telephone Encounter (Signed)
Patient rescheduled for procedure. Please update prep instructions.

## 2022-12-18 NOTE — Telephone Encounter (Signed)
For one extra day ?

## 2022-12-19 DIAGNOSIS — Z1231 Encounter for screening mammogram for malignant neoplasm of breast: Secondary | ICD-10-CM | POA: Diagnosis not present

## 2022-12-26 ENCOUNTER — Encounter: Payer: Medicare PPO | Admitting: Gastroenterology

## 2023-01-16 ENCOUNTER — Other Ambulatory Visit: Payer: Medicare PPO

## 2023-01-23 ENCOUNTER — Encounter: Payer: Self-pay | Admitting: Gastroenterology

## 2023-01-24 ENCOUNTER — Ambulatory Visit: Payer: Medicare PPO

## 2023-02-05 ENCOUNTER — Encounter: Payer: Self-pay | Admitting: Nurse Practitioner

## 2023-02-05 ENCOUNTER — Ambulatory Visit
Admission: RE | Admit: 2023-02-05 | Discharge: 2023-02-05 | Disposition: A | Discharge status: Home / Self Care | Payer: Medicare PPO | Source: Ambulatory Visit | Attending: Nurse Practitioner | Admitting: Nurse Practitioner

## 2023-02-05 DIAGNOSIS — Z8249 Family history of ischemic heart disease and other diseases of the circulatory system: Secondary | ICD-10-CM

## 2023-02-05 DIAGNOSIS — E782 Mixed hyperlipidemia: Secondary | ICD-10-CM

## 2023-02-06 ENCOUNTER — Encounter: Payer: Medicare PPO | Admitting: Gastroenterology

## 2023-03-06 ENCOUNTER — Ambulatory Visit
Admission: RE | Admit: 2023-03-06 | Discharge: 2023-03-06 | Disposition: A | Discharge status: Home / Self Care | Payer: Medicare PPO | Source: Ambulatory Visit | Attending: Nurse Practitioner | Admitting: Nurse Practitioner

## 2023-03-06 DIAGNOSIS — I700 Atherosclerosis of aorta: Secondary | ICD-10-CM | POA: Diagnosis not present

## 2023-03-08 DIAGNOSIS — I10 Essential (primary) hypertension: Secondary | ICD-10-CM | POA: Diagnosis not present

## 2023-03-08 DIAGNOSIS — E1165 Type 2 diabetes mellitus with hyperglycemia: Secondary | ICD-10-CM | POA: Diagnosis not present

## 2023-03-08 DIAGNOSIS — E559 Vitamin D deficiency, unspecified: Secondary | ICD-10-CM | POA: Diagnosis not present

## 2023-03-08 DIAGNOSIS — E782 Mixed hyperlipidemia: Secondary | ICD-10-CM | POA: Diagnosis not present

## 2023-03-12 DIAGNOSIS — R7303 Prediabetes: Secondary | ICD-10-CM | POA: Diagnosis not present

## 2023-03-12 DIAGNOSIS — E782 Mixed hyperlipidemia: Secondary | ICD-10-CM | POA: Diagnosis not present

## 2023-03-12 DIAGNOSIS — I10 Essential (primary) hypertension: Secondary | ICD-10-CM | POA: Diagnosis not present

## 2023-03-12 DIAGNOSIS — E559 Vitamin D deficiency, unspecified: Secondary | ICD-10-CM | POA: Diagnosis not present

## 2023-04-04 ENCOUNTER — Ambulatory Visit (AMBULATORY_SURGERY_CENTER): Payer: Medicare PPO | Admitting: *Deleted

## 2023-04-04 VITALS — Ht 69.0 in | Wt 160.0 lb

## 2023-04-04 DIAGNOSIS — Z1211 Encounter for screening for malignant neoplasm of colon: Secondary | ICD-10-CM

## 2023-04-04 MED ORDER — NA SULFATE-K SULFATE-MG SULF 17.5-3.13-1.6 GM/177ML PO SOLN: 1.0000 | Freq: Once | ORAL | 0 refills | Status: AC

## 2023-04-04 NOTE — Progress Notes (Signed)
Pt's name and DOB verified at the beginning of the pre-visit.  Pt denies any difficulty with ambulating,sitting, laying down or rolling side to side Gave both [facility] main # and MD on call # prior to instructions.  No egg or soy allergy known to patient  No issues known to pt with past sedation with any surgeries or procedures Patient denies ever being intubated Pt has no issues moving head neck or swallowing No FH of Malignant Hyperthermia Pt is not on diet pills Pt is not on home 02  Pt is not on blood thinners  Pt denies issues with constipation  Pt is not on dialysis Pt denies any upcoming cardiac testing Pt encouraged to use to use Singlecare or Goodrx to reduce cost  Patient's chart reviewed by Cathlyn Parsons CNRA prior to pre-visit and patient appropriate for the [facility].  Pre-visit completed and red dot placed by patient's name on their procedure day (on provider's schedule).  . Visit by phone Pt states weight is 160 lb Instructed pt why it is important to and  to call if they have any changes in health or new medications. Directed them to the # given and on instructions.   Pt states they will.  Instructions reviewed with pt and pt states understanding. Instructed to review again prior to procedure. Pt states they will.  Instructions sent by mail with coupon and by my chart

## 2023-04-26 ENCOUNTER — Encounter: Payer: Medicare PPO | Admitting: Gastroenterology

## 2023-05-11 DIAGNOSIS — W57XXXA Bitten or stung by nonvenomous insect and other nonvenomous arthropods, initial encounter: Secondary | ICD-10-CM | POA: Diagnosis not present

## 2023-05-11 DIAGNOSIS — S30860A Insect bite (nonvenomous) of lower back and pelvis, initial encounter: Secondary | ICD-10-CM | POA: Diagnosis not present

## 2023-06-05 DIAGNOSIS — E559 Vitamin D deficiency, unspecified: Secondary | ICD-10-CM | POA: Diagnosis not present

## 2023-06-05 DIAGNOSIS — R7303 Prediabetes: Secondary | ICD-10-CM | POA: Diagnosis not present

## 2023-06-08 DIAGNOSIS — I10 Essential (primary) hypertension: Secondary | ICD-10-CM | POA: Diagnosis not present

## 2023-06-08 DIAGNOSIS — E559 Vitamin D deficiency, unspecified: Secondary | ICD-10-CM | POA: Diagnosis not present

## 2023-06-08 DIAGNOSIS — R7303 Prediabetes: Secondary | ICD-10-CM | POA: Diagnosis not present

## 2023-06-12 ENCOUNTER — Encounter: Payer: Medicare PPO | Admitting: Gastroenterology

## 2023-09-27 DIAGNOSIS — R5383 Other fatigue: Secondary | ICD-10-CM | POA: Diagnosis not present

## 2023-09-27 DIAGNOSIS — E559 Vitamin D deficiency, unspecified: Secondary | ICD-10-CM | POA: Diagnosis not present

## 2023-09-27 DIAGNOSIS — Z0000 Encounter for general adult medical examination without abnormal findings: Secondary | ICD-10-CM | POA: Diagnosis not present

## 2023-09-27 DIAGNOSIS — E782 Mixed hyperlipidemia: Secondary | ICD-10-CM | POA: Diagnosis not present

## 2023-09-27 DIAGNOSIS — E039 Hypothyroidism, unspecified: Secondary | ICD-10-CM | POA: Diagnosis not present

## 2023-09-27 DIAGNOSIS — I10 Essential (primary) hypertension: Secondary | ICD-10-CM | POA: Diagnosis not present

## 2023-09-27 DIAGNOSIS — Z23 Encounter for immunization: Secondary | ICD-10-CM | POA: Diagnosis not present

## 2023-09-27 DIAGNOSIS — Z114 Encounter for screening for human immunodeficiency virus [HIV]: Secondary | ICD-10-CM | POA: Diagnosis not present

## 2023-09-27 DIAGNOSIS — Z13220 Encounter for screening for lipoid disorders: Secondary | ICD-10-CM | POA: Diagnosis not present

## 2023-09-27 DIAGNOSIS — Z862 Personal history of diseases of the blood and blood-forming organs and certain disorders involving the immune mechanism: Secondary | ICD-10-CM | POA: Diagnosis not present

## 2023-10-01 DIAGNOSIS — I10 Essential (primary) hypertension: Secondary | ICD-10-CM | POA: Diagnosis not present

## 2023-10-01 DIAGNOSIS — E559 Vitamin D deficiency, unspecified: Secondary | ICD-10-CM | POA: Diagnosis not present

## 2023-10-01 DIAGNOSIS — E039 Hypothyroidism, unspecified: Secondary | ICD-10-CM | POA: Diagnosis not present

## 2023-10-01 DIAGNOSIS — M722 Plantar fascial fibromatosis: Secondary | ICD-10-CM | POA: Diagnosis not present

## 2023-10-16 DIAGNOSIS — Z0000 Encounter for general adult medical examination without abnormal findings: Secondary | ICD-10-CM | POA: Diagnosis not present

## 2023-10-16 DIAGNOSIS — M810 Age-related osteoporosis without current pathological fracture: Secondary | ICD-10-CM | POA: Diagnosis not present

## 2023-10-16 DIAGNOSIS — Z23 Encounter for immunization: Secondary | ICD-10-CM | POA: Diagnosis not present

## 2023-11-05 DIAGNOSIS — Z1339 Encounter for screening examination for other mental health and behavioral disorders: Secondary | ICD-10-CM | POA: Diagnosis not present

## 2023-11-05 DIAGNOSIS — Z1331 Encounter for screening for depression: Secondary | ICD-10-CM | POA: Diagnosis not present

## 2023-11-05 DIAGNOSIS — Z0000 Encounter for general adult medical examination without abnormal findings: Secondary | ICD-10-CM | POA: Diagnosis not present

## 2023-11-20 DIAGNOSIS — H40003 Preglaucoma, unspecified, bilateral: Secondary | ICD-10-CM | POA: Diagnosis not present

## 2023-11-20 DIAGNOSIS — H43393 Other vitreous opacities, bilateral: Secondary | ICD-10-CM | POA: Diagnosis not present

## 2023-11-20 DIAGNOSIS — H2513 Age-related nuclear cataract, bilateral: Secondary | ICD-10-CM | POA: Diagnosis not present

## 2024-01-04 DIAGNOSIS — R5383 Other fatigue: Secondary | ICD-10-CM | POA: Diagnosis not present

## 2024-01-04 DIAGNOSIS — I10 Essential (primary) hypertension: Secondary | ICD-10-CM | POA: Diagnosis not present

## 2024-01-28 DIAGNOSIS — E039 Hypothyroidism, unspecified: Secondary | ICD-10-CM | POA: Diagnosis not present

## 2024-01-28 DIAGNOSIS — R0981 Nasal congestion: Secondary | ICD-10-CM | POA: Diagnosis not present

## 2024-01-28 DIAGNOSIS — M810 Age-related osteoporosis without current pathological fracture: Secondary | ICD-10-CM | POA: Diagnosis not present

## 2024-01-28 DIAGNOSIS — I10 Essential (primary) hypertension: Secondary | ICD-10-CM | POA: Diagnosis not present

## 2024-01-28 DIAGNOSIS — D649 Anemia, unspecified: Secondary | ICD-10-CM | POA: Diagnosis not present

## 2024-01-28 DIAGNOSIS — F411 Generalized anxiety disorder: Secondary | ICD-10-CM | POA: Diagnosis not present

## 2024-02-05 DIAGNOSIS — Z1231 Encounter for screening mammogram for malignant neoplasm of breast: Secondary | ICD-10-CM | POA: Diagnosis not present

## 2024-02-26 DIAGNOSIS — J069 Acute upper respiratory infection, unspecified: Secondary | ICD-10-CM | POA: Diagnosis not present

## 2024-10-02 DIAGNOSIS — Z23 Encounter for immunization: Secondary | ICD-10-CM | POA: Diagnosis not present

## 2024-10-16 DIAGNOSIS — Z13220 Encounter for screening for lipoid disorders: Secondary | ICD-10-CM | POA: Diagnosis not present

## 2024-10-16 DIAGNOSIS — E559 Vitamin D deficiency, unspecified: Secondary | ICD-10-CM | POA: Diagnosis not present

## 2024-10-16 DIAGNOSIS — R5383 Other fatigue: Secondary | ICD-10-CM | POA: Diagnosis not present

## 2024-10-16 DIAGNOSIS — Z0000 Encounter for general adult medical examination without abnormal findings: Secondary | ICD-10-CM | POA: Diagnosis not present

## 2024-10-16 DIAGNOSIS — R7303 Prediabetes: Secondary | ICD-10-CM | POA: Diagnosis not present

## 2024-10-16 DIAGNOSIS — E782 Mixed hyperlipidemia: Secondary | ICD-10-CM | POA: Diagnosis not present

## 2024-10-21 DIAGNOSIS — R7303 Prediabetes: Secondary | ICD-10-CM | POA: Diagnosis not present

## 2024-10-21 DIAGNOSIS — Z862 Personal history of diseases of the blood and blood-forming organs and certain disorders involving the immune mechanism: Secondary | ICD-10-CM | POA: Diagnosis not present

## 2024-10-21 DIAGNOSIS — Z0000 Encounter for general adult medical examination without abnormal findings: Secondary | ICD-10-CM | POA: Diagnosis not present

## 2024-10-21 DIAGNOSIS — Z1211 Encounter for screening for malignant neoplasm of colon: Secondary | ICD-10-CM | POA: Diagnosis not present
